# Patient Record
Sex: Female | Born: 1985 | Hispanic: No | Marital: Single | Smoking: Never smoker
Health system: Southern US, Community
[De-identification: ages and names within clinical notes are randomized; demographics above are authoritative.]

## PROBLEM LIST (undated history)

## (undated) DIAGNOSIS — F329 Major depressive disorder, single episode, unspecified: Secondary | ICD-10-CM

## (undated) DIAGNOSIS — F32A Depression, unspecified: Secondary | ICD-10-CM

## (undated) DIAGNOSIS — F419 Anxiety disorder, unspecified: Secondary | ICD-10-CM

## (undated) DIAGNOSIS — I10 Essential (primary) hypertension: Secondary | ICD-10-CM

---
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
## 2005-05-03 ENCOUNTER — Observation Stay: Payer: Self-pay | Admitting: Obstetrics and Gynecology

## 2005-05-06 ENCOUNTER — Observation Stay: Payer: Self-pay | Admitting: Obstetrics and Gynecology

## 2005-05-17 ENCOUNTER — Observation Stay: Payer: Self-pay | Admitting: Obstetrics and Gynecology

## 2005-05-27 ENCOUNTER — Inpatient Hospital Stay (HOSPITAL_COMMUNITY): Admission: AD | Admit: 2005-05-27 | Discharge: 2005-06-03 | Payer: Self-pay | Admitting: Family Medicine

## 2005-05-27 ENCOUNTER — Observation Stay: Payer: Self-pay | Admitting: Certified Nurse Midwife

## 2005-05-27 ENCOUNTER — Ambulatory Visit: Payer: Self-pay | Admitting: Family Medicine

## 2005-05-28 ENCOUNTER — Ambulatory Visit: Payer: Self-pay | Admitting: *Deleted

## 2005-06-04 ENCOUNTER — Inpatient Hospital Stay (HOSPITAL_COMMUNITY): Admission: AD | Admit: 2005-06-04 | Discharge: 2005-06-04 | Payer: Self-pay | Admitting: *Deleted

## 2005-06-06 ENCOUNTER — Inpatient Hospital Stay (HOSPITAL_COMMUNITY): Admission: AD | Admit: 2005-06-06 | Discharge: 2005-06-06 | Payer: Self-pay | Admitting: Obstetrics & Gynecology

## 2005-06-06 ENCOUNTER — Ambulatory Visit: Payer: Self-pay | Admitting: Obstetrics and Gynecology

## 2006-02-03 ENCOUNTER — Emergency Department: Payer: Self-pay | Admitting: Emergency Medicine

## 2007-07-19 ENCOUNTER — Emergency Department: Payer: Self-pay | Admitting: Emergency Medicine

## 2007-07-19 ENCOUNTER — Other Ambulatory Visit: Payer: Self-pay

## 2008-04-15 ENCOUNTER — Emergency Department: Payer: Self-pay

## 2008-12-17 ENCOUNTER — Emergency Department: Payer: Self-pay | Admitting: Emergency Medicine

## 2009-04-21 ENCOUNTER — Emergency Department: Payer: Self-pay | Admitting: Emergency Medicine

## 2010-12-07 IMAGING — CT CT HEAD WITHOUT CONTRAST
2 series · 16 of 30 positions shown, 20 images · non-contrast
Comparison: none

REASON FOR EXAM: Frontal headache daily for several months, now has
blurry vision
COMMENTS:

PROCEDURE:     CT  - CT HEAD WITHOUT CONTRAST  - December 18, 2008  [DATE]
RESULT:     Technique: Helical 5mm sections were obtained from the skull
base to the vertex without administration of intravenous contrast.

[Series 2: without · axial · non-contrast · 0.44mm/px · z∈[-90,+30]mm · 13 of 30 slices shown, 17 images]
[im 3/30  brain]
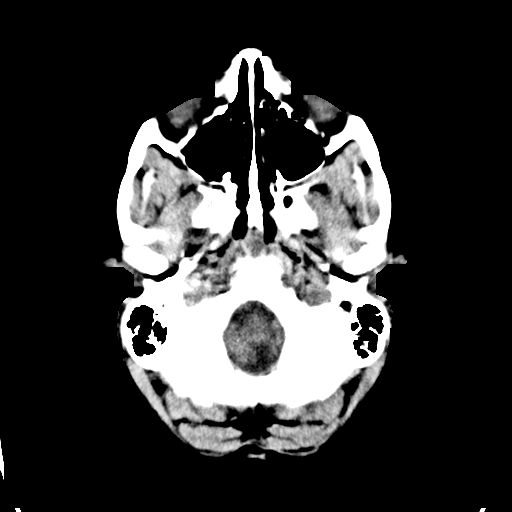
[im 3/30  bone]
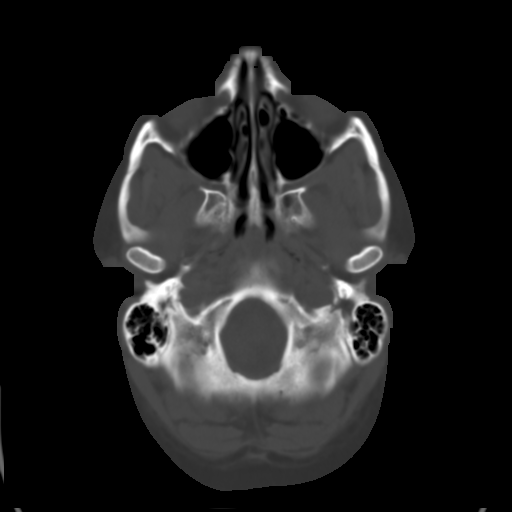
[im 5/30  brain]
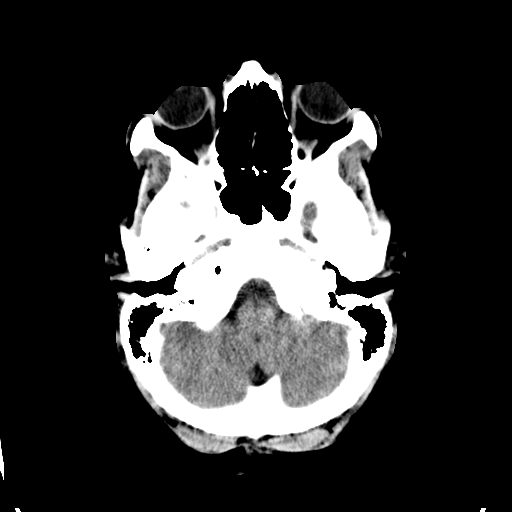
[im 7/30  brain]
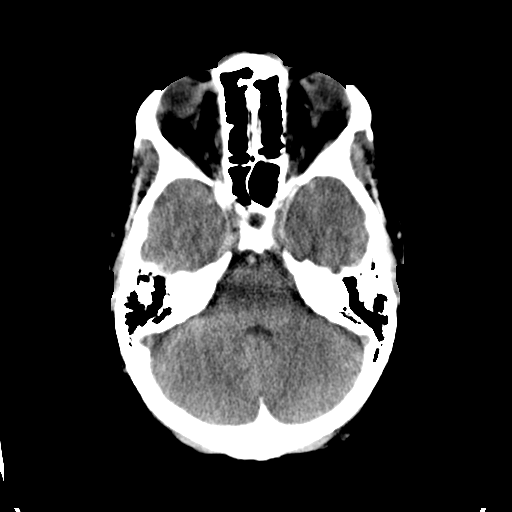
[im 9/30  brain]
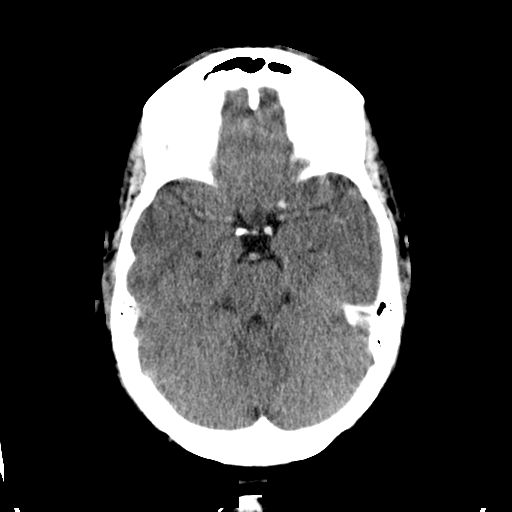
[im 11/30  brain]
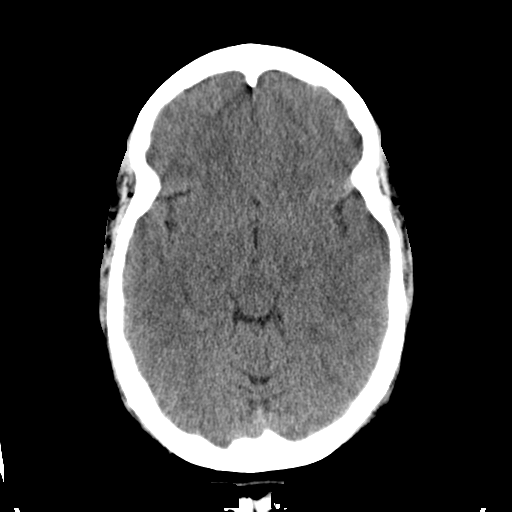
[im 11/30  bone]
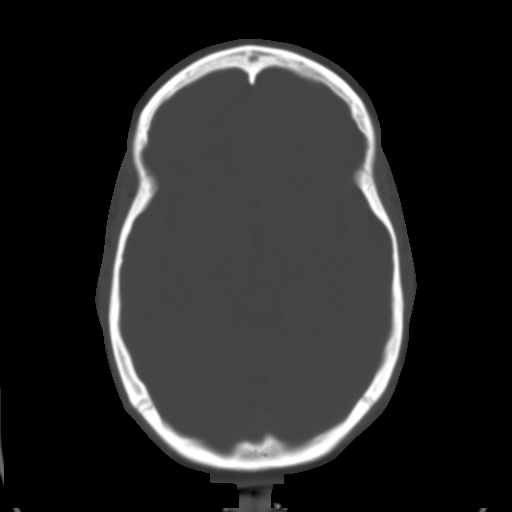
[im 13/30  brain]
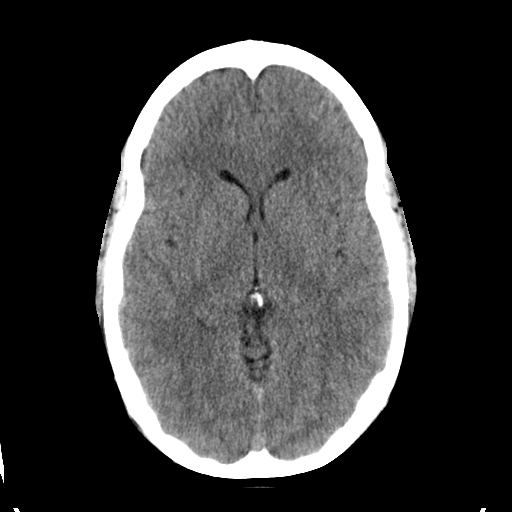
[im 15/30  brain]
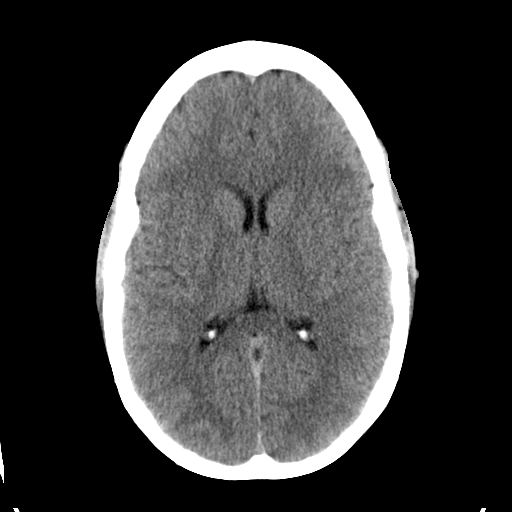
[im 17/30  brain]
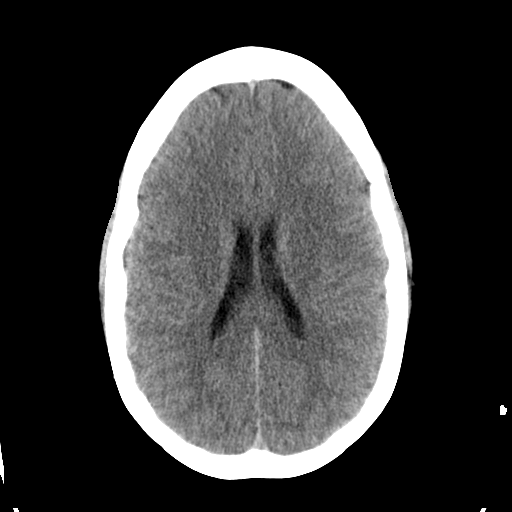
[im 19/30  brain]
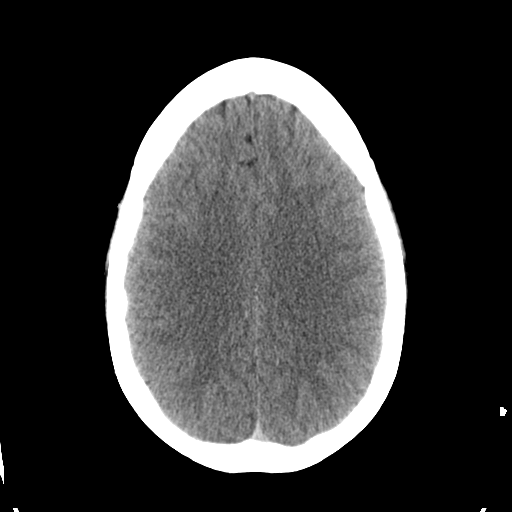
[im 19/30  bone]
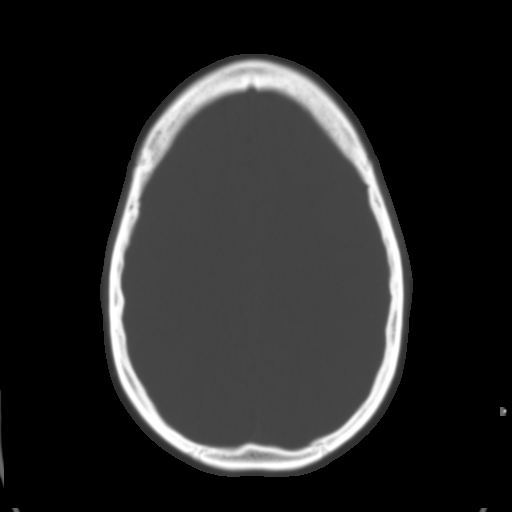
[im 21/30  brain]
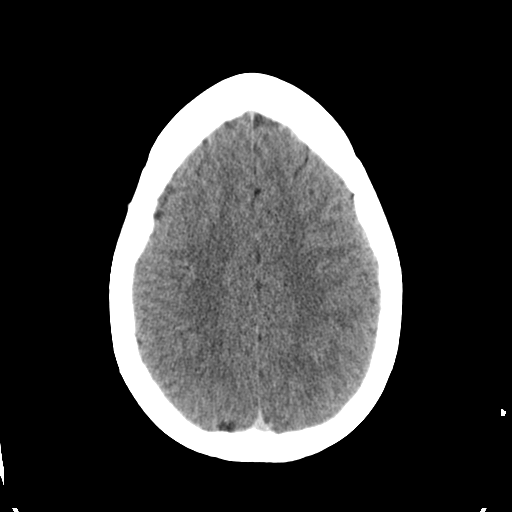
[im 23/30  brain]
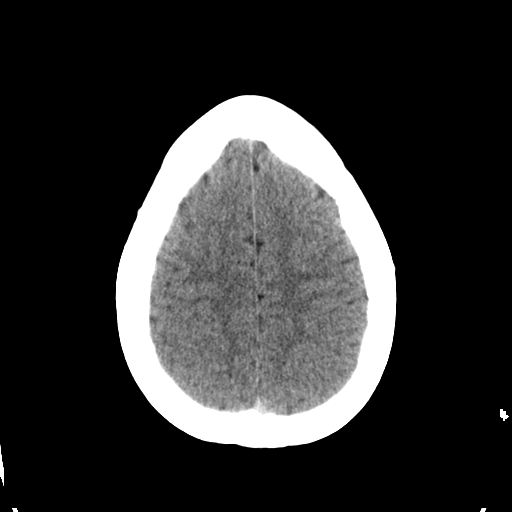
[im 25/30  brain]
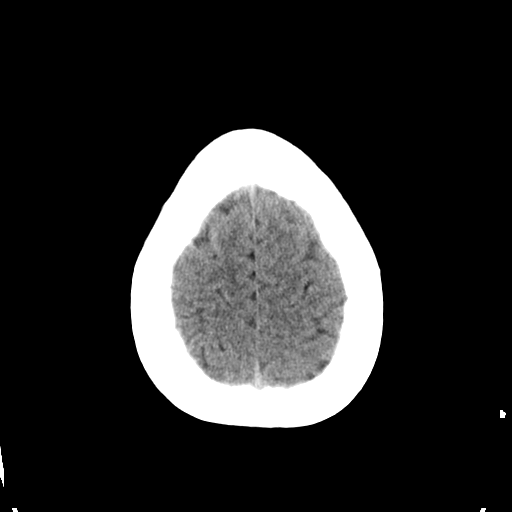
[im 27/30  brain]
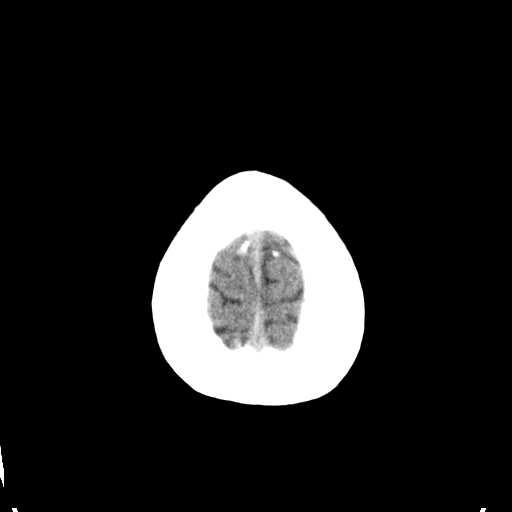
[im 27/30  bone]
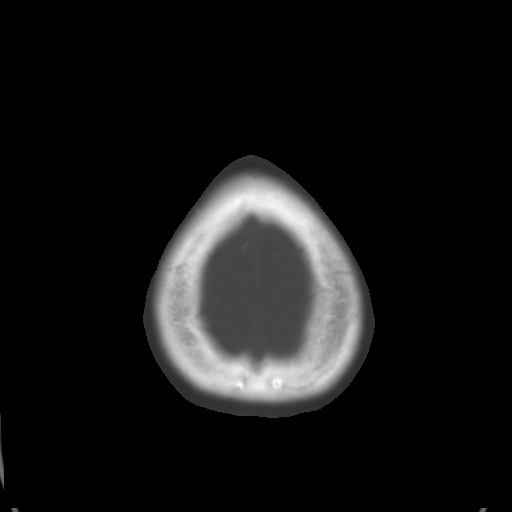

[Series 3: bone · axial · 0.44mm/px · z∈[-90,-50]mm · 3 of 30 slices shown]
[im 3/30  bone]
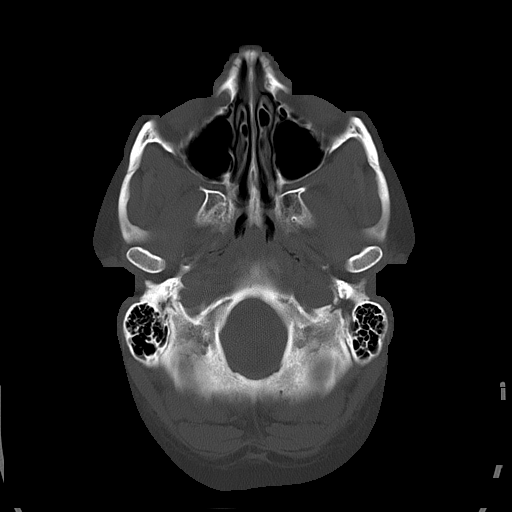
[im 7/30  bone]
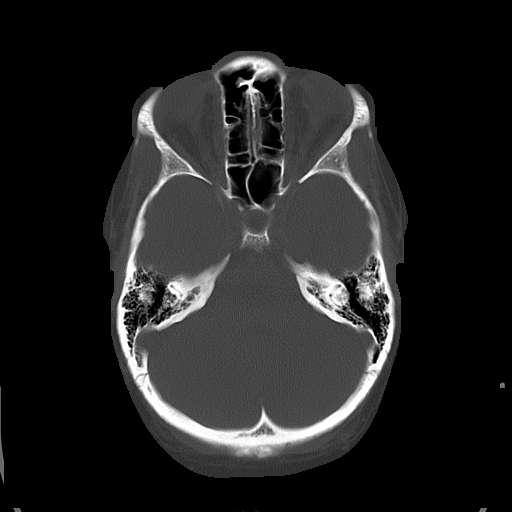
[im 11/30  bone]
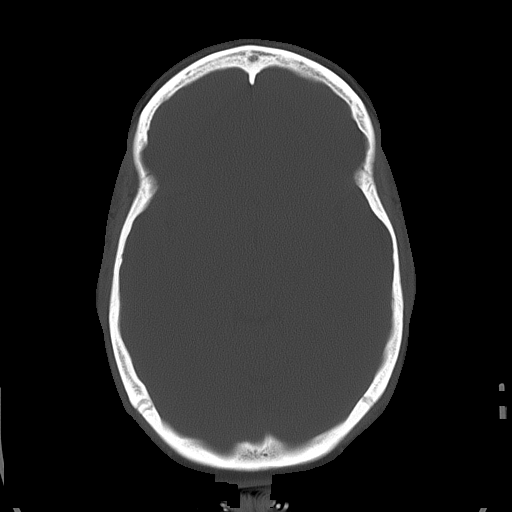

[16 of 30 positions shown; findings below may reference images not displayed]

FINDINGS: There is not evidence of intra-axial fluid collections. There is
no evidence of acute hemorrhage or secondary signs reflecting mass effect or
subacute or chronic focal territorial infarction. The osseous structures
demonstrate no evidence of a depressed skull fracture. If there is
persistent concern clinical follow-up with MRI is recommended.
IMPRESSION: 1. No evidence of acute intracranial abnormalitites.
2. Savio Locklear physician's assistant of the emergency department was informed
of these findings via a preliminary faxed report on 12/18/2008 at [DATE] a.m.
[employer] time

## 2013-12-31 ENCOUNTER — Emergency Department: Payer: Self-pay | Admitting: Emergency Medicine

## 2013-12-31 LAB — URINALYSIS, COMPLETE
BACTERIA: NONE SEEN
BILIRUBIN, UR: NEGATIVE
BLOOD: NEGATIVE
Glucose,UR: NEGATIVE mg/dL (ref 0–75)
Ketone: NEGATIVE
Leukocyte Esterase: NEGATIVE
Nitrite: NEGATIVE
Ph: 5 (ref 4.5–8.0)
Protein: NEGATIVE
Specific Gravity: 1.027 (ref 1.003–1.030)

## 2013-12-31 LAB — CBC WITH DIFFERENTIAL/PLATELET
BASOS ABS: 0 10*3/uL (ref 0.0–0.1)
BASOS PCT: 0.3 %
EOS ABS: 0.1 10*3/uL (ref 0.0–0.7)
Eosinophil %: 1.3 %
HCT: 43.9 % (ref 35.0–47.0)
HGB: 14.9 g/dL (ref 12.0–16.0)
Lymphocyte #: 0.8 10*3/uL — ABNORMAL LOW (ref 1.0–3.6)
Lymphocyte %: 7.9 %
MCH: 29.9 pg (ref 26.0–34.0)
MCHC: 33.9 g/dL (ref 32.0–36.0)
MCV: 88 fL (ref 80–100)
MONO ABS: 0.5 x10 3/mm (ref 0.2–0.9)
MONOS PCT: 5.5 %
NEUTROS ABS: 8.3 10*3/uL — AB (ref 1.4–6.5)
NEUTROS PCT: 85 %
PLATELETS: 248 10*3/uL (ref 150–440)
RBC: 4.97 10*6/uL (ref 3.80–5.20)
RDW: 12.9 % (ref 11.5–14.5)
WBC: 9.8 10*3/uL (ref 3.6–11.0)

## 2013-12-31 LAB — COMPREHENSIVE METABOLIC PANEL
ALBUMIN: 3.9 g/dL (ref 3.4–5.0)
ALT: 32 U/L
AST: 18 U/L (ref 15–37)
Alkaline Phosphatase: 77 U/L
Anion Gap: 7 (ref 7–16)
BILIRUBIN TOTAL: 2 mg/dL — AB (ref 0.2–1.0)
BUN: 15 mg/dL (ref 7–18)
CALCIUM: 8.4 mg/dL — AB (ref 8.5–10.1)
CHLORIDE: 107 mmol/L (ref 98–107)
Co2: 24 mmol/L (ref 21–32)
Creatinine: 0.88 mg/dL (ref 0.60–1.30)
GLUCOSE: 96 mg/dL (ref 65–99)
OSMOLALITY: 276 (ref 275–301)
POTASSIUM: 3.8 mmol/L (ref 3.5–5.1)
SODIUM: 138 mmol/L (ref 136–145)
TOTAL PROTEIN: 7.6 g/dL (ref 6.4–8.2)

## 2013-12-31 LAB — LIPASE, BLOOD: Lipase: 103 U/L (ref 73–393)

## 2015-03-02 ENCOUNTER — Ambulatory Visit
Admission: EM | Admit: 2015-03-02 | Discharge: 2015-03-02 | Disposition: A | Discharge status: Home / Self Care | Payer: BLUE CROSS/BLUE SHIELD | Attending: Family Medicine | Admitting: Family Medicine

## 2015-03-02 ENCOUNTER — Encounter: Payer: Self-pay | Admitting: Emergency Medicine

## 2015-03-02 DIAGNOSIS — J0100 Acute maxillary sinusitis, unspecified: Secondary | ICD-10-CM

## 2015-03-02 DIAGNOSIS — H6501 Acute serous otitis media, right ear: Secondary | ICD-10-CM | POA: Diagnosis not present

## 2015-03-02 HISTORY — DX: Essential (primary) hypertension: I10

## 2015-03-02 MED ORDER — AMOXICILLIN 875 MG PO TABS: 875.0000 mg | ORAL_TABLET | Freq: Two times a day (BID) | ORAL | Status: DC

## 2015-03-02 MED ORDER — GUAIFENESIN-CODEINE 100-10 MG/5ML PO SOLN: 10.0000 mL | Freq: Four times a day (QID) | ORAL | Status: DC | PRN

## 2015-03-02 NOTE — ED Provider Notes (Signed)
CSN: [id number]     Arrival date & time 03/02/15  1109 History   First MD Initiated Contact with Patient 03/02/15 1219     Chief Complaint  Patient presents with  . Fever  . Cough   (Consider location/radiation/quality/duration/timing/severity/associated sxs/prior Treatment) Patient is a 29 y.o. female presenting with URI. The history is provided by the patient.  URI Presenting symptoms: congestion, cough, ear pain, facial pain, fever and rhinorrhea   Presenting symptoms: no sore throat   Onset quality:  Sudden Duration:  6 days Timing:  Constant Progression:  Worsening Chronicity:  New Associated symptoms: headaches, sinus pain and wheezing     Past Medical History  Diagnosis Date  . Hypertension    History reviewed. No pertinent past surgical history. History reviewed. No pertinent family history. Social History  Substance Use Topics  . Smoking status: Never Smoker   . Smokeless tobacco: None  . Alcohol Use: No   OB History    No data available     Review of Systems  Constitutional: Positive for fever.  HENT: Positive for congestion, ear pain and rhinorrhea. Negative for sore throat.   Respiratory: Positive for cough and wheezing.   Neurological: Positive for headaches.    Allergies  Review of patient's allergies indicates no known allergies.  Home Medications   Prior to Admission medications   Medication Sig Start Date End Date Taking? Authorizing Provider  amoxicillin (AMOXIL) 875 MG tablet Take 1 tablet (875 mg total) by mouth 2 (two) times daily. 03/02/15   Payton Mccallumrlando Bakari Nikolai, MD  guaiFENesin-codeine 100-10 MG/5ML syrup Take 10 mLs by mouth every 6 (six) hours as needed for cough. 03/02/15   Payton Mccallumrlando Lavoris Canizales, MD   Meds Ordered and Administered this Visit  Medications - No data to display  BP 136/92 mmHg  Pulse 100  Temp(Src) 97.3 F (36.3 C) (Tympanic)  Resp 17  Ht 5\' 4"  (1.626 m)  Wt 190 lb (86.183 kg)  BMI 32.60 kg/m2  SpO2 98% No data  found.   Physical Exam  Constitutional: She appears well-developed and well-nourished. No distress.  HENT:  Head: Normocephalic and atraumatic.  Right Ear: External ear and ear canal normal. Tympanic membrane is injected, erythematous and bulging.  Left Ear: Tympanic membrane, external ear and ear canal normal.  Nose: Mucosal edema and rhinorrhea present. No nose lacerations, sinus tenderness, nasal deformity, septal deviation or nasal septal hematoma. No epistaxis.  No foreign bodies. Right sinus exhibits maxillary sinus tenderness and frontal sinus tenderness. Left sinus exhibits maxillary sinus tenderness and frontal sinus tenderness.  Mouth/Throat: Uvula is midline, oropharynx is clear and moist and mucous membranes are normal. No oropharyngeal exudate.  Eyes: Conjunctivae and EOM are normal. Pupils are equal, round, and reactive to light. Right eye exhibits no discharge. Left eye exhibits no discharge. No scleral icterus.  Neck: Normal range of motion. Neck supple. No thyromegaly present.  Cardiovascular: Normal rate, regular rhythm and normal heart sounds.   Pulmonary/Chest: Effort normal and breath sounds normal. No respiratory distress. She has no wheezes. She has no rales.  Lymphadenopathy:    She has no cervical adenopathy.  Skin: No rash noted. She is not diaphoretic.  Nursing note and vitals reviewed.   ED Course  Procedures (including critical care time)  Labs Review Labs Reviewed - No data to display  Imaging Review No results found.   Visual Acuity Review  Right Eye Distance:   Left Eye Distance:   Bilateral Distance:    Right Eye Near:  Left Eye Near:    Bilateral Near:         MDM   1. Right acute serous otitis media, recurrence not specified   2. Acute maxillary sinusitis, recurrence not specified    Discharge Medication List as of 03/02/2015 12:30 PM    START taking these medications   Details  amoxicillin (AMOXIL) 875 MG tablet Take 1 tablet  (875 mg total) by mouth 2 (two) times daily., Starting 03/02/2015, Until Discontinued, Normal    guaiFENesin-codeine 100-10 MG/5ML syrup Take 10 mLs by mouth every 6 (six) hours as needed for cough., Starting 03/02/2015, Until Discontinued, Print      1. diagnosis reviewed with patient  2. rx as per orders above; reviewed possible side effects, interactions, risks and benefits  3. Recommend supportive treatment with increased fluids, otc analgesics prn 4. Follow-up prn if symptoms worsen or don't improve     Payton Mccallum, MD 03/02/15 1241

## 2015-03-02 NOTE — ED Notes (Signed)
Patient c/o runny nose, cough, chest congestion, nasal congestion since last Thursday.

## 2016-03-27 ENCOUNTER — Ambulatory Visit
Admission: EM | Admit: 2016-03-27 | Discharge: 2016-03-27 | Disposition: A | Discharge status: Home / Self Care | Payer: BLUE CROSS/BLUE SHIELD | Attending: Family Medicine | Admitting: Family Medicine

## 2016-03-27 DIAGNOSIS — T148XXA Other injury of unspecified body region, initial encounter: Secondary | ICD-10-CM

## 2016-03-27 HISTORY — DX: Major depressive disorder, single episode, unspecified: F32.9

## 2016-03-27 HISTORY — DX: Depression, unspecified: F32.A

## 2016-03-27 HISTORY — DX: Anxiety disorder, unspecified: F41.9

## 2016-03-27 MED ORDER — NAPROXEN 500 MG PO TABS: 500.0000 mg | ORAL_TABLET | Freq: Two times a day (BID) | ORAL | 0 refills | Status: DC

## 2016-03-27 MED ORDER — METHOCARBAMOL 500 MG PO TABS: 500.0000 mg | ORAL_TABLET | Freq: Two times a day (BID) | ORAL | 0 refills | Status: DC

## 2016-03-27 NOTE — Discharge Instructions (Signed)
May use heat to help relax area. Cautious with taking muscle relaxer do not drive while taking this. Take NSAIDS as needed.  Try to move area after taking pain meds so area does not become stiff. Go to the ER if you begin having chest pain or sob.

## 2016-03-27 NOTE — ED Provider Notes (Signed)
CSN: [id number]     Arrival date & time 03/27/16  0803 History   First MD Initiated Contact with Patient 03/27/16 [phone]     Chief Complaint  Patient presents with  . Muscle Pain   (Consider location/radiation/quality/duration/timing/severity/associated sxs/prior Treatment) Pt is here for LT neck, arm pain for the past 4 days after "sleeping wrong". Pain to neck with ROM of arm. Denies any sob, no chest pain. Denies any injury. Has taken motrin with no relief. Takes bp meds for bp but no this am.       Past Medical History:  Diagnosis Date  . Anxiety   . Depression   . Hypertension    History reviewed. No pertinent surgical history. History reviewed. No pertinent family history. Social History  Substance Use Topics  . Smoking status: Never Smoker  . Smokeless tobacco: Never Used  . Alcohol use No   OB History    No data available     Review of Systems  Constitutional: Negative.   Respiratory: Negative.   Cardiovascular: Negative.   Musculoskeletal: Positive for neck stiffness.  Skin: Negative.   Neurological: Negative.     Allergies  Patient has no known allergies.  Home Medications   Prior to Admission medications   Medication Sig Start Date End Date Taking? Authorizing Provider  hydrochlorothiazide (HYDRODIURIL) 25 MG tablet Take 25 mg by mouth daily.   Yes Historical Provider, MD  venlafaxine XR (EFFEXOR-XR) 150 MG 24 hr capsule Take 150 mg by mouth daily with breakfast.   Yes Historical Provider, MD  amoxicillin (AMOXIL) 875 MG tablet Take 1 tablet (875 mg total) by mouth 2 (two) times daily. 03/02/15   Payton Mccallumrlando Conty, MD  guaiFENesin-codeine 100-10 MG/5ML syrup Take 10 mLs by mouth every 6 (six) hours as needed for cough. 03/02/15   Payton Mccallumrlando Conty, MD   Meds Ordered and Administered this Visit  Medications - No data to display  BP (!) 148/100 (BP Location: Left Arm)   Pulse 84   Temp 98.5 F (36.9 C) (Oral)   Resp 18   Ht 5\' 5"  (1.651 m)   Wt 198 lb  (89.8 kg)   SpO2 100%   BMI 32.95 kg/m  No data found.   Physical Exam  Constitutional: She is oriented to person, place, and time. She appears well-developed.  Neck: Normal range of motion.  Cardiovascular: Normal rate and regular rhythm.   Pulmonary/Chest: Effort normal and breath sounds normal.  Musculoskeletal: She exhibits tenderness.  Pain with flexion of lt arm into neck. Denies any numbness,  Neurological: She is oriented to person, place, and time.  Skin: Skin is warm and dry.    Urgent Care Course   Clinical Course     Procedures (including critical care time)  Labs Review Labs Reviewed - No data to display  Imaging Review No results found.           MDM   1. Muscle strain    May use heat to help relax area. Cautious with taking muscle relaxer do not drive while taking this. Take NSAIDS as needed.  Try to move area after taking pain meds so area does not become stiff. Go to the ER if you begin having chest pain or sob.  Take your blood pressure meds    Tobi BastosMelanie A Izaiyah Kleinman, NP 03/27/16 [phone]

## 2016-03-27 NOTE — ED Triage Notes (Addendum)
Pt reports she awoke 4 days ago with left arm and left chest pain which feels like she pulled a muscle. Unable to lift left arm over her head and unable to lift arm above shoulder height to the side. Pain 9/10. Pt holding her shoulders unevenly in triage. Denies any other sx other than feeling like it spasms

## 2016-03-30 ENCOUNTER — Telehealth: Payer: Self-pay

## 2016-03-30 NOTE — Telephone Encounter (Signed)
Courtesy call back completed today for patient's recent visit at [facility]. Patient did not answer, left message on machine to call back with any questions or concerns.   

## 2023-07-03 ENCOUNTER — Inpatient Hospital Stay
Admission: RE | Admit: 2023-07-03 | Discharge: 2023-07-03 | Disposition: A | Discharge status: Home / Self Care | Payer: Self-pay | Source: Ambulatory Visit | Attending: Orthopedic Surgery | Admitting: Orthopedic Surgery

## 2023-07-03 ENCOUNTER — Other Ambulatory Visit: Payer: Self-pay | Admitting: Family Medicine

## 2023-07-03 DIAGNOSIS — Z049 Encounter for examination and observation for unspecified reason: Secondary | ICD-10-CM

## 2023-07-14 NOTE — Progress Notes (Unsigned)
 Referring Physician:  Rayetta Humphrey, MD [address] [place],  [place] [address]  Primary Physician:  Patient, No Pcp Per  History of Present Illness: 07/20/2023 Ms. Sueko Dimichele has a history of fatty liver disease, depression, HTN, obesity.   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 She's has some minimal intermittent pain in her lower back that was tolerable.   1 year history of constant LBP with no leg pain. Started after caring her grandmother and having to lift her. Pain is worse with walking, lifting, standing, turning in bed. Some relief with resting. No numbness, tingling, or weakness.   She does not smoke.   Bowel/Bladder Dysfunction: none  Conservative measures: acupuncture- helped short term Physical therapy: has not participated in Multimodal medical therapy including regular antiinflammatories:  robaxin, naproxen  Injections:  no epidural steroid injections  Past Surgery: no spinal surgeries  Aevah D Petitfrere has no symptoms of cervical myelopathy.  The symptoms are causing a significant impact on the patient's life.   Review of Systems:  A 10 point review of systems is negative, except for the pertinent positives and negatives detailed in the HPI.  Past Medical History: Past Medical History:  Diagnosis Date   Anxiety    Depression    Hypertension     Past Surgical History: History reviewed. No pertinent surgical history.  Allergies: Allergies as of 07/20/2023   (No Known Allergies)    Medications: Outpatient Encounter Medications as of 07/20/2023  Medication Sig   Ashwagandha 500 MG CAPS Take by mouth.   [DISCONTINUED] amoxicillin (AMOXIL) 875 MG tablet Take 1 tablet (875 mg total) by mouth 2 (two) times daily.   [DISCONTINUED] guaiFENesin-codeine 100-10 MG/5ML syrup Take 10 mLs by mouth every 6 (six) hours as needed for cough.   [DISCONTINUED] hydrochlorothiazide (HYDRODIURIL) 25 MG tablet Take 25 mg by mouth  daily.   [DISCONTINUED] methocarbamol (ROBAXIN) 500 MG tablet Take 1 tablet (500 mg total) by mouth 2 (two) times daily.   [DISCONTINUED] naproxen (NAPROSYN) 500 MG tablet Take 1 tablet (500 mg total) by mouth 2 (two) times daily.   [DISCONTINUED] venlafaxine XR (EFFEXOR-XR) 150 MG 24 hr capsule Take 150 mg by mouth daily with breakfast.   No facility-administered encounter medications on file as of 07/20/2023.    Social History: Social History   Tobacco Use   Smoking status: Never   Smokeless tobacco: Never  Substance Use Topics   Alcohol use: No   Drug use: No    Family Medical History: History reviewed. No pertinent family history.  Physical Examination: Vitals:   07/20/23 1007  BP: 126/88    General: Patient is well developed, well nourished, calm, collected, and in no apparent distress. Attention to examination is appropriate.  Respiratory: Patient is breathing without any difficulty.   NEUROLOGICAL:     Awake, alert, oriented to person, place, and time.  Speech is clear and fluent. Fund of knowledge is appropriate.   Cranial Nerves: Pupils equal round and reactive to light.  Facial tone is symmetric.    She has diffuse lower lumbar tenderness.   No abnormal lesions on exposed skin.   Strength: Side Biceps Triceps Deltoid Interossei Grip Wrist Ext. Wrist Flex.  R 5 5 5 5 5 5 5   L 5 5 5 5 5 5 5    Side Iliopsoas Quads Hamstring PF DF EHL  R 5 5 5 5 5 5   L 5 5 5  5 5 5    Reflexes are 2+ and symmetric at the biceps, brachioradialis, patella and achilles.   Hoffman's is absent.  Clonus is not present.   Bilateral upper and lower extremity sensation is intact to light touch.     No pain with IR/ER of her hips.   Gait is normal.     Medical Decision Making  Imaging: Lumbar xrays dated 06/15/23:  FINDINGS/IMPRESSION:   5 nonrib-bearing lumbar segments.  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  Please correlate with symptomatology.   Vertebral bodies are without acute fracture.  The remainder the disc spaces are well-preserved.  Mild right SI joint arthrosis which is nonspecific.  Partially visualized hip joint spaces preserved.   Electronically Signed by:  Dennis Bast, MD, [facility]  Electronically Signed on:  06/15/2023 1:29 PM   I have personally reviewed the images and agree with the above interpretation.  Lumbar xrays reviewed from 04/15/2008- she had pars defect at L5, but slip was not as bad.   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 She's has some minimal intermittent pain in her lower back that was tolerable.   1 year history of constant LBP with no leg pain. Started after caring her grandmother and having to lift her. Pain is worse with walking, lifting, standing, turning in bed. Some relief with resting. No numbness, tingling, or weakness.   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 This is likely cause of her LBP.   Treatment options discussed with patient and following plan made:   - MRI of lumbar spine to further evaluate slip L5-S1. Requests WIDE BORE MRI.  - Xrays of lumbar spine with flex/ext when she has MRI done.  - Depending on results, will consider PT and/or injections.  - Will schedule follow up visit to review MRI results once I get them back.   I spent a total of 35 minutes in face-to-face and non-face-to-face activities related to this patient's care today including review of outside records, review of imaging, review of symptoms, physical exam, discussion of differential diagnosis, discussion of treatment options, and documentation.   Thank you for involving me in the care of this patient.   Drake Leach PA-C Dept. of Neurosurgery

## 2023-07-20 ENCOUNTER — Encounter: Payer: Self-pay | Admitting: Orthopedic Surgery

## 2023-07-20 ENCOUNTER — Ambulatory Visit (INDEPENDENT_AMBULATORY_CARE_PROVIDER_SITE_OTHER): Admitting: Orthopedic Surgery

## 2023-07-20 VITALS — BP 126/88 | Ht 65.0 in | Wt 258.0 lb

## 2023-07-20 DIAGNOSIS — M4300 Spondylolysis, site unspecified: Secondary | ICD-10-CM

## 2023-07-20 DIAGNOSIS — M5127 Other intervertebral disc displacement, lumbosacral region: Secondary | ICD-10-CM

## 2023-07-20 DIAGNOSIS — M4306 Spondylolysis, lumbar region: Secondary | ICD-10-CM

## 2023-07-20 DIAGNOSIS — M4316 Spondylolisthesis, lumbar region: Secondary | ICD-10-CM

## 2023-07-20 NOTE — Patient Instructions (Signed)
 It was so nice to see you today. Thank you so much for coming in.    You have a pars defect at L5 with slip at L5-S1. This is likely causing your back pain.   I want to get an MRI of your lower back to look into things further. We will get this approved through your insurance and [facility] will call you to schedule the appointment. Remind them to do your xrays when you get the MRI. Ask about your patient responsibility. You do not need to pay this prior to getting MRI, they can bill you.   Make sure you are scheduled for the WIDE BORE MRI.   After you have the MRI or xrays, it takes 14-21 days for me to get the results back. Once I have them, we will call you to schedule a follow up  visit with me to review them.   Please do not hesitate to call if you have any questions or concerns. You can also message me in MyChart.   Drake Leach PA-C [phone]     The physicians and staff at [facility] Neurosurgery at [facility] are committed to providing excellent care. You may receive a survey asking for feedback about your experience at our office. We value you your feedback and appreciate you taking the time to to fill it out. The [facility] leadership team is also available to discuss your experience in person, feel free to contact us [phone].

## 2023-08-07 ENCOUNTER — Other Ambulatory Visit: Payer: Self-pay

## 2023-08-07 DIAGNOSIS — M4300 Spondylolysis, site unspecified: Secondary | ICD-10-CM

## 2023-08-07 DIAGNOSIS — M4316 Spondylolisthesis, lumbar region: Secondary | ICD-10-CM

## 2023-08-07 NOTE — Progress Notes (Signed)
 Physical therapy order has been placed because MRI was denied by insurance due to patient not having completed any therapy.

## 2024-02-13 NOTE — Progress Notes (Signed)
 Provider: Sharene JONETTA Ruth, CNM Division: General Obstetrics, Gynecology, and Midwifery  Initial Prenatal Note  Assessment/Plan    38 y.o. [id number] presents for initial prenatal visit at [redacted]w[redacted]d by L/12 Estimated Date of Delivery: 08/21/24, with a history significant for:  Problem List       Respiratory   Asthma (HHS-HCC)   Albuterol PRN        Other   Obesity affecting pregnancy, antepartum (HHS-HCC)   Pregravid BMI: 43.27  First trimester: [ ]  Baseline HELLP labs + UP/C [ ]  TSH [ ]  A1c [ ]  Consider nutrition consult [ ]  Consider LDASA from 12-16 wks (if 1 high-risk factor or 2 moderate risk factors) [ ]  Screen for OSA; [ ]  refer to sleep medicine if indicated [ ]  Consider baseline EKG if BMI > 40 [ ]  Echocardiogram if abnormal EKG, CHTN, or signs/symptoms of suspected cardiac etiology (chest pain, SOB, palpitations, etc).  Second & Third trimesters (based on pre-gravid BMI): [ ]  Targeted US  if BMI >=35 [ ]  3rd trimester growth US  for BMI 35-39 [ ]  Serial growth US  from 28 wks if BMI >= 40 or unable to assess FH [ ]  Weekly NST from 34 wks if BMI >= 40 [ ]  Delivery by EDD if BMI >= 40 [ ]  Anesthesia consult if cannot walk 2 blocks without stopping or do light housework without stopping, if cannot lie flat, if currently on or will be on anticoagulants       Relevant Orders   Hemoglobin A1c   T4, Free   TSH   AST   ALT   Uric acid   Protein/Creatinine Ratio, Urine   Creatinine   US  OB High Risk Anatomy, Singleton (>18 weeks)   History of preterm delivery   Delivered at 27 weeks secondary to severe pre-x      History of essential hypertension   S/p 2007 delivery due to severe pre-x, continued elevated blood pressures Was treated with hydrochlorothiazide 25 mg daily only for few years after delivery Has not been on meds since then, only diet and exercise to manage blood pressures with good control        Dermoid cyst of ovary   Viability US : A left ovarian mass  is seen with measurements above (10cm x 11cm x 8cm) The mass appears to be comprised of both cystic and  solid components. There are hyperechoic intracystic balls that highly suggestive of a dermoid.   Plan to follow up with GYN Surgeon to discuss treatment options Ovarian torsion pre-cautions reviewed      Anxiety and depression   Currently seeing mental health care provider: Yes, in therapy Medication use: no Previous hospitalization for mood: no. Desires mental health referral No EPDS First T: 0 EPDS Second T: EPDS Third T: Postpartum: [ ]  2 week mood (RN/CNM/mental health provider) [ ]  medication adjustments       AMA (advanced maternal age) multigravida 35+, first trimester (HHS-HCC) - Primary   CNM Risk: 1 (AMA, Obesity, Anxiety/Depression) independent CNM management with modifications to routine prenatal care) NOB Labs Ordered and collected Physical Exam completed Centering: unsure Taking Baby ASA? Yes Last pap: 11/2019; NIL done at [facility] 32 week EFW by palpation or ultrasound: delete if <35 yo  Doula: Ask at next visit  Special considerations/concerns:  Social Patient:  Partner:  Latoya Price      Relevant Orders   CBC   Rubella Antibody, IgG   Varicella zoster antibody, IgG   Chlamydia/Gonorrhoeae NAA  Hepatitis B Surface Antigen   Hepatitis C Antibody   HIV Antigen/Antibody Combo   Syphilis Screen   Cystic Fibrosis DNA Panel   Hemoglobin/Thalassemia Profile   Urine Culture   Type and Screen with Confirmation ABORh   Hemoglobin A1c   Spinal Muscular Atrophy (SMN 1/2) DNA Assay   [facility] Panorama Prenatal Test Full Panel   ABO/RH   US  OB High Risk Anatomy, Singleton (>18 weeks)    Routine Prenatal Care: Discussed philosophy of CNM service and collaboration with or referral to MDs as needed  Reviewed that we only attend births at [facility] in [place] CNM pager and how to reach us  for emergencies reviewed Reviewed induction policy and  support of physiologic birth Reviewed Centering pregnancy and prenatal visit schedule  Initial Prenatal teaching Confirmed EDD with patient Medication safety in pregnancy reviewed Avoidance of smoking, second-hand smoke, alcohol, and harmful drugs Dental care and keeping appointments with Dentist Occupational exposures: none Risk factors for pre-eclampsia: History of pre-eclampsia in a previous pregnancy (high), High blood pressure prior to pregnancy  (high), Pregravid BMI greater than 30  (moderate), Black or African American  (moderate), Age 90 age or older @ time of delivery  (moderate), More than 10 years since last delivery  (moderate), and Hx of stillbirth or low weight baby (less than 5.5lbs) in a prior pregnancy  (moderate) Start low dose aspirin after 12 weeks until delivery if one high or two moderate risk factors: Indicated. Patient education provided and patient will start after 12 weeks. Aspirin 81 mg added to medication list.  Nutrition and exercise Prenatal vitamin with iron and folic acid Pregnancy nutrition, weight gain: Based on her pregravid BMI of 43.27, obese, and recommended weight gain is 11-20 lbs. Nutrition counseling reviewed, including general nutrition guidance and education. Exercise counseling reviewed, including recommendations to increase activity/exercise and benefits of routine exercise on overall health. Food safety discussed including seafood consumption and listeriosis precautions Nutrition counseling reviewed, including nutrition/dietary handout and general nutrition guidance and education.  Exercise counseling reviewed, including benefits of routine exercise on overall health.  Infectious disease precautions/Immunizations Toxoplasmosis precautions given Reviewed increased risk of complications from flu and COVID when pregnant. Recommend flu vaccine during flu season and COVID vaccine as appropriate.  Reviewed benefits of tdap and RSV vaccines for baby.  Recommend Tdap and RSV vaccines in 3rd trimester as appropriate Per [facility]: Rubella: Has documentation of two doses of vaccine per [facility]. Varicella: Has documentation of two doses of vaccine per [facility].  Antenatal testing Penicillin allergy status reviewed.  Patient is not allergic. Risks, benefits and limitations of aneuploidy screening and testing discussed. Patient Ordered to be done with NOB labs, wants sex chromosomes evaluated and reported. Baby's sex can be shared in MyChart message  Risks, benefits and limitations of carrier screening discussed. Patient desires  Cystic fibrosis - pending Spinal muscular atrophy - pending Hemoglobinopathy screening - pending Role of ultrasound in pregnancy discussed; plan: first trimester dating scan and targeted anatomy scan for AMA and Obesity  Antenatal testing: Plan kick counts from 28 weeks with additional testing as indicated by clinical circumstances  Postpartum Reviewed maternal/infant health benefits of breastfeeding.  EPDS completed today and will follow up and monitor throughout parturition: Patient is at moderate risk for developing mood changes, but we discussed coping strategies and how to have good self-intuition into their  coping and mood and asking for help when needed.  Patient knows to contact us  anytime for assessment of mood changes.  Will continue to monitor throughout pregnancy.   Orders Placed This Encounter  Procedures  . Chlamydia/Gonorrhoeae NAA  . Urine Culture  . CBC  . Rubella Antibody, IgG  . Varicella zoster antibody, IgG  . Hepatitis B Surface Antigen  . Hepatitis C Antibody  . HIV Antigen/Antibody Combo  . Syphilis Screen  . Hemoglobin A1c    Release to patient:   Immediate [1]  . T4, Free  . TSH  . AST  . ALT  . Uric acid  . Protein/Creatinine Ratio, Urine  . Creatinine    Release to patient:   Immediate [1]  . Type and Screen with Confirmation ABORh    Has patient been transfused in the past 90 days?:    NO    Has the patient been pregnant in last 3 months or currently pregnant?:   YES    Release to patient:   Immediate [1]  . ABO/RH    Release to patient:   Immediate [1]  . US  OB High Risk Anatomy, Singleton (>18 weeks)    Indication for high risk scan: AMA, Obesity (BMI> 40)    Standing Status:   Future    Expected Date:   03/20/2024    Expiration Date:   02/12/2025    Reason for Exam::   Anatomy    Is the patient pregnant?:   Yes    Performed at:   [facility]  . Cystic Fibrosis DNA Panel    Specimen Type::   Blood    Indication for Testing::   Carrier screening    By placing this electronic order I confirm the testing ordered herein is medically necessary and this patient has been informed of the details of the genetic test(s) ordered, including the risks, benefits, and alternatives, and has consented to testing.:   Yes    Release to patient:   Immediate [1]  . Hemoglobin/Thalassemia Profile  . Spinal Muscular Atrophy (SMN 1/2) DNA Assay    By placing this electronic order I confirm the testing ordered herein is medically necessary and this patient has been informed of the details of the genetic test(s) ordered, including the risks, benefits, and alternatives, and has consented to testing.:   Yes    Release to patient:   Immediate [1]    Patient Ethnicity?:   African-American    Indication for Testing?:   Routine preconception or prenatal carrier screening  . Jennell Hacker Prenatal Test Full Panel    DO NOT DELETE BELOW THIS LINE ==========Department Information========== PI:[id number] Department:[id number] [place] [address] [place] [address] Dept: [phone] Dept Fax: [phone] Loc: [phone]      Expected Due Date (MM/DD/YYYY) - Gestational Age must be at least 9 weeks::   08/21/2024    Is this a twin pregnancy?:   Not Twin Pregnancy, Latoya Price    Is this a surrogate or egg donor (not  patient's own egg) pregnancy?:   No    Are fetal sex results to be included in the report?:   Yes    Maternal weight (lbs)::   273.5    Which Microdeletion Panel should be ordered?:   None    What type of billing?:   Automatic Data    By placing this electronic order I confirm the testing ordered herein is medically necessary and this patient has been informed of the details of the genetic test(s) ordered, including the risks, benefits, and alternatives, and  has consented to testing.:   Yes    Method/Type of collection (*NOTE* - If [facility] to Follow Up is selected, 'Class' must be set to External)::   [facility] to follow up with patient for sample collection (mobile phleb)    Encounter department::   [phone];[facility] [place];[address], [address];[place];[address];[phone], [id number];MRN:[id number]    Release to patient:   Immediate [1]    Return in about 6 weeks (around 03/26/2024) for Return OB visit with Midwives.  Future Appointments  Date Time Provider Department Center  02/19/2024 11:40 AM Arleen, Zachary Loving, MD [facility]  03/25/2024 10:40 AM Laurier, Mavis Browning, CNM [facility]        Subjective  Latoya Price is being seen today establish care with CNM service.  This is a planned pregnancy. Patient is at [redacted]w[redacted]d gestation based on LMP c/w 12* wk ultrasound. Current pregnancy is significant for advanced maternal age, depression and/or anxiety, maternal obesity, and pre-eclampsia. Current complaints include: nausea and breast tenderness.  The following portions of the patient's history were reviewed and updated as appropriate: allergies, medications, past medical/surgical/obgyn/family and social histories.  Intimate Partner Violence: Not on file    Review of Systems A 12 point review of systems was negative except for pertinent items noted in the HPI.      Objective  Flow sheet Vitals: Pulse: 87 BP: 132/80 Fetal Heart Rate: 157 Total weight gain: 6.124 kg (13 lb 8 oz)  EPDS Score: Edinburgh Postnatal Depression Scale Total: 0 (02/13/24) and answered The thought of harming myself has occurred to me.: Never (02/13/24)  Constitutional: Well-developed, well-nourished female in no acute distress Neurological: Alert and oriented to person, place, and time Psychiatric: Mood and affect appropriate PE: Deferred
